# Patient Record
Sex: Female | Born: 1965 | Race: White | Hispanic: No | Marital: Married | Smoking: Current some day smoker
Health system: Southern US, Community
[De-identification: ages and names within clinical notes are randomized; demographics above are authoritative.]

## PROBLEM LIST (undated history)

## (undated) DIAGNOSIS — I10 Essential (primary) hypertension: Secondary | ICD-10-CM

---

## 1998-07-02 ENCOUNTER — Other Ambulatory Visit: Admission: RE | Admit: 1998-07-02 | Discharge: 1998-07-02 | Payer: Self-pay | Admitting: Obstetrics and Gynecology

## 1998-12-04 ENCOUNTER — Observation Stay (HOSPITAL_COMMUNITY): Admission: RE | Admit: 1998-12-04 | Discharge: 1998-12-05 | Payer: Self-pay | Admitting: Orthopedic Surgery

## 1999-07-26 ENCOUNTER — Other Ambulatory Visit: Admission: RE | Admit: 1999-07-26 | Discharge: 1999-07-26 | Payer: Self-pay | Admitting: Obstetrics and Gynecology

## 2000-02-12 ENCOUNTER — Ambulatory Visit (HOSPITAL_COMMUNITY): Admission: RE | Admit: 2000-02-12 | Discharge: 2000-02-12 | Payer: Self-pay | Admitting: *Deleted

## 2000-02-12 ENCOUNTER — Encounter: Payer: Self-pay | Admitting: *Deleted

## 2000-02-27 ENCOUNTER — Encounter: Payer: Self-pay | Admitting: *Deleted

## 2000-02-27 ENCOUNTER — Ambulatory Visit (HOSPITAL_COMMUNITY): Admission: RE | Admit: 2000-02-27 | Discharge: 2000-02-27 | Payer: Self-pay | Admitting: *Deleted

## 2000-08-10 ENCOUNTER — Other Ambulatory Visit: Admission: RE | Admit: 2000-08-10 | Discharge: 2000-08-10 | Payer: Self-pay | Admitting: Obstetrics and Gynecology

## 2001-08-30 ENCOUNTER — Other Ambulatory Visit: Admission: RE | Admit: 2001-08-30 | Discharge: 2001-08-30 | Payer: Self-pay | Admitting: Obstetrics and Gynecology

## 2002-02-24 ENCOUNTER — Other Ambulatory Visit: Admission: RE | Admit: 2002-02-24 | Discharge: 2002-02-24 | Payer: Self-pay | Admitting: Obstetrics and Gynecology

## 2002-10-20 ENCOUNTER — Other Ambulatory Visit: Admission: RE | Admit: 2002-10-20 | Discharge: 2002-10-20 | Payer: Self-pay | Admitting: Obstetrics and Gynecology

## 2003-11-10 ENCOUNTER — Other Ambulatory Visit: Admission: RE | Admit: 2003-11-10 | Discharge: 2003-11-10 | Payer: Self-pay | Admitting: Obstetrics and Gynecology

## 2004-12-18 ENCOUNTER — Other Ambulatory Visit: Admission: RE | Admit: 2004-12-18 | Discharge: 2004-12-18 | Payer: Self-pay | Admitting: Obstetrics and Gynecology

## 2016-09-10 ENCOUNTER — Emergency Department (HOSPITAL_COMMUNITY): Payer: 59

## 2016-09-10 ENCOUNTER — Encounter (HOSPITAL_COMMUNITY): Payer: Self-pay | Admitting: Neurology

## 2016-09-10 ENCOUNTER — Emergency Department (HOSPITAL_COMMUNITY)
Admission: EM | Admit: 2016-09-10 | Discharge: 2016-09-10 | Disposition: A | Discharge status: Home / Self Care | Payer: 59 | Attending: Emergency Medicine | Admitting: Emergency Medicine

## 2016-09-10 DIAGNOSIS — Z79899 Other long term (current) drug therapy: Secondary | ICD-10-CM | POA: Diagnosis not present

## 2016-09-10 DIAGNOSIS — F17200 Nicotine dependence, unspecified, uncomplicated: Secondary | ICD-10-CM | POA: Diagnosis not present

## 2016-09-10 DIAGNOSIS — K219 Gastro-esophageal reflux disease without esophagitis: Secondary | ICD-10-CM | POA: Diagnosis not present

## 2016-09-10 DIAGNOSIS — R0602 Shortness of breath: Secondary | ICD-10-CM | POA: Insufficient documentation

## 2016-09-10 DIAGNOSIS — R0789 Other chest pain: Secondary | ICD-10-CM

## 2016-09-10 DIAGNOSIS — I10 Essential (primary) hypertension: Secondary | ICD-10-CM | POA: Insufficient documentation

## 2016-09-10 HISTORY — DX: Essential (primary) hypertension: I10

## 2016-09-10 LAB — BASIC METABOLIC PANEL
Anion gap: 7 (ref 5–15)
BUN: 15 mg/dL (ref 6–20)
CALCIUM: 10.1 mg/dL (ref 8.9–10.3)
CO2: 30 mmol/L (ref 22–32)
CREATININE: 0.7 mg/dL (ref 0.44–1.00)
Chloride: 103 mmol/L (ref 101–111)
GFR calc non Af Amer: >60 mL/min (ref >60)
Glucose, Bld: 120 mg/dL — ABNORMAL HIGH (ref 65–99)
Potassium: 3.9 mmol/L (ref 3.5–5.1)
SODIUM: 140 mmol/L (ref 135–145)

## 2016-09-10 LAB — I-STAT TROPONIN, ED
TROPONIN I, POC: 0 ng/mL (ref 0.00–0.08)
Troponin i, poc: 0 ng/mL (ref 0.00–0.08)

## 2016-09-10 LAB — CBC
HCT: 40.3 % (ref 36.0–46.0)
Hemoglobin: 13.3 g/dL (ref 12.0–15.0)
MCH: 29 pg (ref 26.0–34.0)
MCHC: 33 g/dL (ref 30.0–36.0)
MCV: 88 fL (ref 78.0–100.0)
PLATELETS: 242 10*3/uL (ref 150–400)
RBC: 4.58 MIL/uL (ref 3.87–5.11)
RDW: 12 % (ref 11.5–15.5)
WBC: 7.6 10*3/uL (ref 4.0–10.5)

## 2016-09-10 MED ORDER — GI COCKTAIL ~~LOC~~
30.0000 mL | Freq: Once | ORAL | Status: AC
  Administered 2016-09-10: 30 mL via ORAL
  Filled 2016-09-10: qty 30

## 2016-09-10 MED ORDER — ACETAMINOPHEN 325 MG PO TABS
650.0000 mg | ORAL_TABLET | Freq: Once | ORAL | Status: AC
Start: 2016-09-10 — End: 2016-09-10
  Administered 2016-09-10: 650 mg via ORAL
  Filled 2016-09-10: qty 2

## 2016-09-10 NOTE — ED Provider Notes (Signed)
[facility]-EMERGENCY DEPT Provider Note   CSN: [id number] Arrival date & time: 09/10/16  0944     History   Chief Complaint Chief Complaint  Patient presents with  . Hypertension    HPI Katherine Boyer is a [age] y.o. female with a PMHx of HTN, who presents to the ED with multiple complaints. Her primary complaint is noticing that her blood pressure was going up and causing her to "not feel well". She states that last night she checked it and it was 169/101, this morning it was elevated again so she took her lisinopril 5 mg and after a little while it was 140/109, but because she had a mild headache just didn't feel well she came to the emergency room. Her blood pressure has continued to trend down since arrival. She reports that over the last 3 weeks she had a cough and some sinus drainage, reports that it had been clearing up, admits to taking Sudafed and NyQuil 2 weeks ago but none since then, but states that she continues to feel somewhat short of breath. Her PCP felt that this was likely indigestion she was complaining of some hoarseness in her voice, she was started on omeprazole and just recently started that. She still continues to have mild shortness of breath and chest congestion sensation in her chest. No other treatments tried, doesn't take anything else for her blood pressure, and no known aggravating factors for her symptoms.  She denies fevers, chills, vision changes, diaphoresis, lightheadedness, wheezing, leg swelling, recent travel/surgery/immobilization, estrogen use, personal or family history of DVT/PE, abd pain, N/V/D/C, hematuria, dysuria, myalgias, arthralgias, numbness, tingling, weakness, or any other complaints at this time. Nonsmoker. +FHx of CABG in her father. Of note, admits to being under a lot of stress recently, and thinks this is contributing to her current symptoms   The history is provided by the patient and medical records. No language interpreter was used.    Hypertension  This is a recurrent problem. The current episode started more than 1 week ago. The problem occurs constantly. The problem has been gradually improving. Associated symptoms include chest pain (congestion sensation), headaches and shortness of breath. Pertinent negatives include no abdominal pain. Nothing aggravates the symptoms. Relieved by: lisinopril 5mg . Treatments tried: lisinopril 5mg . The treatment provided moderate relief.    Past Medical History:  Diagnosis Date  . Hypertension     There are no active problems to display for this patient.   History reviewed. No pertinent surgical history.  OB History    No data available       Home Medications    Prior to Admission medications   Not on File    Family History No family history on file.  Social History Social History  Substance Use Topics  . Smoking status: Current Some Day Smoker  . Smokeless tobacco: Never Used  . Alcohol use Yes     Allergies   Patient has no known allergies.   Review of Systems Review of Systems  Constitutional: Negative for chills, diaphoresis and fever.  HENT: Positive for congestion and postnasal drip.   Eyes: Negative for visual disturbance.  Respiratory: Positive for cough and shortness of breath. Negative for wheezing.   Cardiovascular: Positive for chest pain (congestion sensation).  Gastrointestinal: Negative for abdominal pain, constipation, diarrhea, nausea and vomiting.  Genitourinary: Negative for dysuria and hematuria.  Musculoskeletal: Negative for arthralgias and myalgias.  Skin: Negative for color change.  Allergic/Immunologic: Negative for immunocompromised state.  Neurological: Positive for  headaches. Negative for weakness, light-headedness and numbness.  Psychiatric/Behavioral: Negative for confusion.   10 Systems reviewed and are negative for acute change except as noted in the HPI.   Physical Exam Updated Vital Signs BP 148/99 (BP Location:  Right Arm)   Pulse 80   Temp 97.8 F (36.6 C) (Oral)   Resp 12   Ht 5\' 3"  (1.6 m)   Wt 51.3 kg   SpO2 99%   BMI 20.02 kg/m    Physical Exam  Constitutional: She is oriented to person, place, and time. Vital signs are normal. She appears well-developed and well-nourished.  Non-toxic appearance. No distress.  Afebrile, nontoxic, NAD  HENT:  Head: Normocephalic and atraumatic.  Mouth/Throat: Oropharynx is clear and moist and mucous membranes are normal.  Eyes: Conjunctivae and EOM are normal. Pupils are equal, round, and reactive to light. Right eye exhibits no discharge. Left eye exhibits no discharge.  PERRL, EOMI, no nystagmus, no visual field deficits   Neck: Normal range of motion. Neck supple. No spinous process tenderness and no muscular tenderness present. No neck rigidity. Normal range of motion present.  FROM intact without spinous process TTP, no bony stepoffs or deformities, no paraspinous muscle TTP or muscle spasms. No rigidity or meningeal signs. No bruising or swelling.   Cardiovascular: Normal rate, regular rhythm, normal heart sounds and intact distal pulses.  Exam reveals no gallop and no friction rub.   No murmur heard. RRR, nl s1/s2, no m/r/g, distal pulses intact, no pedal edema   Pulmonary/Chest: Effort normal and breath sounds normal. No respiratory distress. She has no decreased breath sounds. She has no wheezes. She has no rhonchi. She has no rales. She exhibits no tenderness, no crepitus, no deformity and no retraction.  CTAB in all lung fields, no w/r/r, no hypoxia or increased WOB, speaking in full sentences, SpO2 99% on RA Chest wall nonTTP without crepitus, deformities, or retractions   Abdominal: Soft. Normal appearance and bowel sounds are normal. She exhibits no distension. There is no tenderness. There is no rigidity, no rebound, no guarding, no CVA tenderness, no tenderness at McBurney's point and negative Murphy's sign.  Musculoskeletal: Normal range of  motion.  MAE x4 Strength and sensation grossly intact in all extremities Distal pulses intact Gait steady  Neurological: She is alert and oriented to person, place, and time. She has normal strength. No cranial nerve deficit or sensory deficit. Coordination and gait normal. GCS eye subscore is 4. GCS verbal subscore is 5. GCS motor subscore is 6.  CN 2-12 grossly intact A&O x4 GCS 15 Sensation and strength intact Gait nonataxic Coordination with finger-to-nose WNL  Skin: Skin is warm, dry and intact. No rash noted.  Psychiatric: She has a normal mood and affect.  Nursing note and vitals reviewed.    ED Treatments / Results  Labs (all labs ordered are listed, but only abnormal results are displayed) Labs Reviewed  BASIC METABOLIC PANEL - Abnormal; Notable for the following:       Result Value   Glucose, Bld 120 (*)    All other components within normal limits  CBC  I-STAT TROPOININ, ED  I-STAT TROPOININ, ED    EKG  EKG Interpretation  Date/Time:  Wednesday September 10 2016 09:54:13 EST Ventricular Rate:  76 PR Interval:  132 QRS Duration: 82 QT Interval:  362 QTC Calculation: 407 R Axis:   71 Text Interpretation:  Normal sinus rhythm Right atrial enlargement Borderline ECG No old tracing to compare Confirmed by  [facility]ension Set[facility] Northwest HospitalCARDAMA MD, PEDRO [phone]) on 09/10/2016 1:37:34 PM       Radiology Dg Chest 2 View  Result Date: 09/10/2016 CLINICAL DATA:  Chest tightness.  Hypertension. EXAM: CHEST  2 VIEW COMPARISON:  None. FINDINGS: Lungs are clear. Heart size and pulmonary vascularity are normal. No adenopathy. No pneumothorax. No bone lesions. IMPRESSION: No edema or consolidation. Electronically Signed   By: Bretta BangWilliam  Woodruff III M.D.   On: 09/10/2016 10:19    Procedures Procedures (including critical care time)  Medications Ordered in ED Medications  gi cocktail (Maalox,Lidocaine,Donnatal) (30 mLs Oral Given 09/10/16 1248)  acetaminophen (TYLENOL) tablet 650 mg (650 mg Oral  Given 09/10/16 1248)     Initial Impression / Assessment and Plan / ED Course  I have reviewed the triage vital signs and the nursing notes.  Pertinent labs & imaging results that were available during my care of the patient were reviewed by me and considered in my medical decision making (see chart for details).  Clinical Course     50 y.o. female here with complaints of hypertension as well as "not feeling well" for the last several weeks. She noticed her blood pressure was 169/101 last night, took her lisinopril 5 mg this morning and that it was 140/109 and has trended down since then. She states that she had a mild headache just not feeling well which she attributed to the hypertension. Over the last 3 weeks she's had a cough and sinus drainage which has been clearing up but she admits to taking Sudafed and NyQuil which likely contributed to her blood pressure, states that even now she still feels like she has something in her chest that she needs to cough up and still feels slightly short of breath. Was started on omeprazole and told that the symptoms were likely related to indigestion, which I feel is likely. Overall, exam benign, no focal neuro deficits, no reproducible chest tenderness, lung exam clear, no tachycardia/hypoxia/LE swelling. Labs completely reassuring and EKG unremarkable. Doubt PE/dissection/ACS/other emergent cardiopulmonary conditions, doubt other emergent intracranial pathologies. Her symptoms are likely multifactorial, some of them coming from her hypertension, and some of them coming from her silent reflux. Given cardiac history in her family, will repeat troponin at 3 hour mark, will give GI cocktail and Tylenol. Will reassess shortly. Doubt need for HTN meds, BP 130s/90s during exam.   2:10 PM Repeat trop neg. Pt feeling better. BP remaining stable in the 140s/90-100s. We could increase her lisinopril to 10mg  daily to see if this helps with her BP, and have her f/up with  PCP. Continue omeprazole, could try additional zantac as needed for additional symptom control. GERD diet modifications discussed. F/up with PCP in 1wk. I explained the diagnosis and have given explicit precautions to return to the ER including for any other new or worsening symptoms. The patient understands and accepts the medical plan as it's been dictated and I have answered their questions. Discharge instructions concerning home care and prescriptions have been given. The patient is STABLE and is discharged to home in good condition.   Final Clinical Impressions(s) / ED Diagnoses   Final diagnoses:  Essential hypertension  Atypical chest pain  SOB (shortness of breath)  Gastroesophageal reflux disease, esophagitis presence not specified    New Prescriptions New Prescriptions   No medications on file     Allen DerryMercedes Camprubi-Soms, PA-C 09/10/16 1410    Nira ConnPedro Eduardo Cardama, MD 09/10/16 [phone]

## 2016-09-10 NOTE — ED Triage Notes (Signed)
Pt reports HTN, h/a. For last few weeks has had cough, sinus drainage. Has been taking lisinopril. Reports CP and SOB, has been intermittent for last few weeks.

## 2016-09-10 NOTE — Discharge Instructions (Signed)
Your chest pain is likely from silent reflux. You will need to take your omeprazole as directed, may consider using additional zantac as needed for additional relief, and avoid spicy/fatty/acidic foods, avoid soda/coffee/tea/alcohol. Avoid laying down flat within 30 minutes of eating. Avoid NSAIDs like ibuprofen/aleve/motrin/etc on an empty stomach. May consider using over the counter tums/maalox as needed for additional relief. Use tylenol as needed for pain.   For your blood pressure, increase your lisinopril to 10mg  daily. Keep a record of your blood pressure readings to take to your doctor at your next visit. Avoid salty foods. Follow up with your regular doctor in one week for ongoing evaluation of your symptoms. Return to the ER for changes or worsening symptoms.

## 2016-10-17 DIAGNOSIS — Z01419 Encounter for gynecological examination (general) (routine) without abnormal findings: Secondary | ICD-10-CM | POA: Diagnosis not present

## 2016-10-29 DIAGNOSIS — K219 Gastro-esophageal reflux disease without esophagitis: Secondary | ICD-10-CM | POA: Diagnosis not present

## 2016-11-21 DIAGNOSIS — Z1211 Encounter for screening for malignant neoplasm of colon: Secondary | ICD-10-CM | POA: Diagnosis not present

## 2016-11-21 DIAGNOSIS — R1310 Dysphagia, unspecified: Secondary | ICD-10-CM | POA: Diagnosis not present

## 2016-11-21 DIAGNOSIS — K219 Gastro-esophageal reflux disease without esophagitis: Secondary | ICD-10-CM | POA: Diagnosis not present

## 2016-11-21 DIAGNOSIS — K621 Rectal polyp: Secondary | ICD-10-CM | POA: Diagnosis not present

## 2016-11-21 DIAGNOSIS — K222 Esophageal obstruction: Secondary | ICD-10-CM | POA: Diagnosis not present

## 2016-11-21 DIAGNOSIS — R490 Dysphonia: Secondary | ICD-10-CM | POA: Diagnosis not present

## 2016-11-28 DIAGNOSIS — H40003 Preglaucoma, unspecified, bilateral: Secondary | ICD-10-CM | POA: Diagnosis not present

## 2017-01-12 DIAGNOSIS — R1310 Dysphagia, unspecified: Secondary | ICD-10-CM | POA: Diagnosis not present

## 2017-03-18 DIAGNOSIS — J039 Acute tonsillitis, unspecified: Secondary | ICD-10-CM | POA: Diagnosis not present

## 2017-03-18 DIAGNOSIS — J0100 Acute maxillary sinusitis, unspecified: Secondary | ICD-10-CM | POA: Diagnosis not present

## 2017-05-28 DIAGNOSIS — R21 Rash and other nonspecific skin eruption: Secondary | ICD-10-CM | POA: Diagnosis not present

## 2017-08-28 DIAGNOSIS — J329 Chronic sinusitis, unspecified: Secondary | ICD-10-CM | POA: Diagnosis not present

## 2017-08-28 DIAGNOSIS — J40 Bronchitis, not specified as acute or chronic: Secondary | ICD-10-CM | POA: Diagnosis not present

## 2017-09-08 DIAGNOSIS — J189 Pneumonia, unspecified organism: Secondary | ICD-10-CM | POA: Diagnosis not present

## 2017-10-27 DIAGNOSIS — Z01419 Encounter for gynecological examination (general) (routine) without abnormal findings: Secondary | ICD-10-CM | POA: Diagnosis not present

## 2017-11-06 DIAGNOSIS — H1013 Acute atopic conjunctivitis, bilateral: Secondary | ICD-10-CM | POA: Diagnosis not present

## 2017-11-12 DIAGNOSIS — M8580 Other specified disorders of bone density and structure, unspecified site: Secondary | ICD-10-CM | POA: Diagnosis not present

## 2017-12-02 DIAGNOSIS — R531 Weakness: Secondary | ICD-10-CM | POA: Diagnosis not present

## 2018-03-07 ENCOUNTER — Emergency Department (HOSPITAL_COMMUNITY)
Admission: EM | Admit: 2018-03-07 | Discharge: 2018-03-07 | Disposition: A | Discharge status: Home / Self Care | Payer: 59 | Attending: Emergency Medicine | Admitting: Emergency Medicine

## 2018-03-07 ENCOUNTER — Encounter (HOSPITAL_COMMUNITY): Payer: Self-pay | Admitting: Emergency Medicine

## 2018-03-07 ENCOUNTER — Other Ambulatory Visit: Payer: Self-pay

## 2018-03-07 ENCOUNTER — Emergency Department (HOSPITAL_COMMUNITY): Payer: 59

## 2018-03-07 DIAGNOSIS — R638 Other symptoms and signs concerning food and fluid intake: Secondary | ICD-10-CM | POA: Diagnosis not present

## 2018-03-07 DIAGNOSIS — F17200 Nicotine dependence, unspecified, uncomplicated: Secondary | ICD-10-CM | POA: Diagnosis not present

## 2018-03-07 DIAGNOSIS — R0789 Other chest pain: Secondary | ICD-10-CM | POA: Insufficient documentation

## 2018-03-07 DIAGNOSIS — K219 Gastro-esophageal reflux disease without esophagitis: Secondary | ICD-10-CM

## 2018-03-07 DIAGNOSIS — I10 Essential (primary) hypertension: Secondary | ICD-10-CM | POA: Insufficient documentation

## 2018-03-07 DIAGNOSIS — R079 Chest pain, unspecified: Secondary | ICD-10-CM

## 2018-03-07 LAB — BASIC METABOLIC PANEL
Anion gap: 11 (ref 5–15)
BUN: 11 mg/dL (ref 6–20)
CHLORIDE: 104 mmol/L (ref 101–111)
CO2: 22 mmol/L (ref 22–32)
CREATININE: 0.7 mg/dL (ref 0.44–1.00)
Calcium: 9.5 mg/dL (ref 8.9–10.3)
GFR calc Af Amer: >60 mL/min (ref >60)
GFR calc non Af Amer: >60 mL/min (ref >60)
Glucose, Bld: 101 mg/dL — ABNORMAL HIGH (ref 65–99)
Potassium: 3.6 mmol/L (ref 3.5–5.1)
SODIUM: 137 mmol/L (ref 135–145)

## 2018-03-07 LAB — CBC
HEMATOCRIT: 40.8 % (ref 36.0–46.0)
Hemoglobin: 13.6 g/dL (ref 12.0–15.0)
MCH: 28.6 pg (ref 26.0–34.0)
MCHC: 33.3 g/dL (ref 30.0–36.0)
MCV: 85.9 fL (ref 78.0–100.0)
PLATELETS: 210 10*3/uL (ref 150–400)
RBC: 4.75 MIL/uL (ref 3.87–5.11)
RDW: 11.3 % — AB (ref 11.5–15.5)
WBC: 6.4 10*3/uL (ref 4.0–10.5)

## 2018-03-07 LAB — I-STAT TROPONIN, ED: TROPONIN I, POC: 0 ng/mL (ref 0.00–0.08)

## 2018-03-07 MED ORDER — GI COCKTAIL ~~LOC~~
30.0000 mL | Freq: Once | ORAL | Status: AC
  Administered 2018-03-07: 30 mL via ORAL
  Filled 2018-03-07: qty 30

## 2018-03-07 MED ORDER — OMEPRAZOLE 40 MG PO CPDR: 40.0000 mg | DELAYED_RELEASE_CAPSULE | Freq: Every day | ORAL | 1 refills | Status: AC

## 2018-03-07 MED ORDER — ONDANSETRON HCL 4 MG/2ML IJ SOLN
4.0000 mg | Freq: Once | INTRAMUSCULAR | Status: AC
  Administered 2018-03-07: 4 mg via INTRAVENOUS
  Filled 2018-03-07: qty 2

## 2018-03-07 MED ORDER — FAMOTIDINE IN NACL 20-0.9 MG/50ML-% IV SOLN
20.0000 mg | Freq: Once | INTRAVENOUS | Status: AC
  Administered 2018-03-07: 20 mg via INTRAVENOUS
  Filled 2018-03-07: qty 50

## 2018-03-07 MED ORDER — SODIUM CHLORIDE 0.9 % IV BOLUS
1000.0000 mL | Freq: Once | INTRAVENOUS | Status: AC
  Administered 2018-03-07: 1000 mL via INTRAVENOUS

## 2018-03-07 NOTE — Discharge Instructions (Addendum)
I have written you a prescription for Omeprazole. You may continue taking this daily for symptom improvement. You may follow-up with your PCP to discuss the need for additional medication to control symptoms.

## 2018-03-07 NOTE — ED Notes (Signed)
Pt. Transported to xray 

## 2018-03-07 NOTE — ED Provider Notes (Signed)
[facility] EMERGENCY DEPARTMENT Provider Note  CSN: [id number] Arrival date & time: 03/07/18  1844  History   Chief Complaint Chief Complaint  Patient presents with  . Chest Pain   HPI Katherine Boyer is a 52 y.o. female with a medical history of HTN and GERD who presented to the ED for midsternal chest pain x2 weeks. She describes the pain as tightness and heaviness. She states it has awaken her from her sleep. Patient states it feels different from heartburn pain she has had in the past. She cannot identify any changes with positions or with eating. Associated symptoms: decreased appetite and nausea. Patient has tried omeprazole and Tums prior to coming to the ED, but has not taken any in over a week because she ran out.  Denies SOB, palpitations, diaphoresis, leg swelling, lightheadedness, dizziness or abdominal pain.   Past Medical History:  Diagnosis Date  . Hypertension     There are no active problems to display for this patient.   History reviewed. No pertinent surgical history.   OB History   None      Home Medications    Prior to Admission medications   Medication Sig Start Date End Date Taking? Authorizing Provider  solifenacin (VESICARE) 5 MG tablet Take 5 mg by mouth daily. 02/27/18  Yes [provider]  omeprazole (PRILOSEC) 40 MG capsule Take 1 capsule (40 mg total) by mouth daily. 03/07/18 04/06/18  Mortis, Sharyon Medicus, PA-C    Family History No family history on file.  Social History Social History   Tobacco Use  . Smoking status: Current Some Day Smoker  . Smokeless tobacco: Never Used  Substance Use Topics  . Alcohol use: Yes  . Drug use: Not on file     Allergies   Patient has no known allergies.   Review of Systems Review of Systems  Constitutional: Positive for appetite change. Negative for diaphoresis and fever.  Respiratory: Positive for chest tightness. Negative for shortness of breath.   Cardiovascular:  Positive for chest pain. Negative for palpitations and leg swelling.  Gastrointestinal: Positive for nausea and vomiting. Negative for abdominal pain.  Endocrine: Negative.   Neurological: Negative.      Physical Exam Updated Vital Signs BP (!) 139/91   Pulse 83   Temp 99.6 F (37.6 C) (Oral)   Resp 20   Ht 5\' 4"  (1.626 m)   Wt 51.3 kg (113 lb)   SpO2 96%   BMI 19.40 kg/m   Physical Exam  Constitutional: She appears well-developed and well-nourished. She does not appear ill.  Cardiovascular: Normal rate, regular rhythm, intact distal pulses and normal pulses.  Pulmonary/Chest: Effort normal and breath sounds normal.  Abdominal: Soft. Bowel sounds are normal. There is no tenderness.  Musculoskeletal:       Right lower leg: She exhibits no edema.       Left lower leg: She exhibits no edema.  Skin: Skin is warm and dry. Capillary refill takes less than 2 seconds. She is not diaphoretic. No cyanosis. Nails show no clubbing.  Nursing note and vitals reviewed.    ED Treatments / Results  Labs (all labs ordered are listed, but only abnormal results are displayed) Labs Reviewed  BASIC METABOLIC PANEL - Abnormal; Notable for the following components:      Result Value   Glucose, Bld 101 (*)    All other components within normal limits  CBC - Abnormal; Notable for the following components:   RDW  11.3 (*)    All other components within normal limits  I-STAT TROPONIN, ED  I-STAT BETA HCG BLOOD, ED ([facility], [facility], [facility] ONLY)    EKG None  Radiology Dg Chest 2 View  Result Date: 03/07/2018 CLINICAL DATA:  Pt c/o CP for longer than 1 week. Pt started to notice the pain 7 days ago when she ran out of Heartburn meds. Pt has been taking OTC meds for heartburn. Pt is central chest, feels like pressure. Pt c/o N/V starting today. Pain radiates to L arm. EXAM: CHEST - 2 VIEW COMPARISON:  None. FINDINGS: Normal mediastinum and cardiac silhouette. Normal pulmonary vasculature. No evidence of  effusion, infiltrate, or pneumothorax. No acute bony abnormality. IMPRESSION: Normal chest radiograph. Electronically Signed   By: Genevive Bi M.D.   On: 03/07/2018 20:12    Procedures Procedures (including critical care time)  Medications Ordered in ED Medications  gi cocktail (Maalox,Lidocaine,Donnatal) (has no administration in time range)  sodium chloride 0.9 % bolus 1,000 mL (1,000 mLs Intravenous New Bag/Given 03/07/18 2100)  famotidine (PEPCID) IVPB 20 mg premix (20 mg Intravenous New Bag/Given 03/07/18 2101)  gi cocktail (Maalox,Lidocaine,Donnatal) (30 mLs Oral Given 03/07/18 2100)  ondansetron (ZOFRAN) injection 4 mg (4 mg Intravenous Given 03/07/18 2100)     Initial Impression / Assessment and Plan / ED Course  Triage vital signs and the nursing notes have been reviewed.  Pertinent labs & imaging results that were available during care of the patient were reviewed and considered in medical decision making (see chart for details).  Patient presents in no acute distress and is well appearing. HEART score was 2 and is at low risk of an acute cardiac event today. Physical exam, initial EKG, CXR and troponin are reassuring that patient is not experiencing an acute cardiac or pulmonary event. Denies systemic s/s that suggest an infectious etiology. History is more consistent with GI etiology for midsternal/epigastric pain. Likely GERD as there are no associations with eating and patient does not have these symptoms when she is on her PPI. No signs that she has an acute GI blood loss that requires additional evaluation.  Clinical Course as of Mar 07 2140  Sun Mar 07, 2018  2015 EKG showed NSR. No ST elevations/depressions or signs of acute ischemia or infarct. This is reassuring in combination with negative troponin which assists in evaluating and ruling out an acute cardiac process. Labs and CXR are also reassuring.    [GM]  2106 GI cocktail, Pepcid and Zofran administered for acute  relief. NS bolus x1 given as well. Will reassess pt after completion.   [GM]  2137 Pt reports pain improvement after IV fluids, Pepcid, Zofran and GI cocktail.   [GM]    Clinical Course User Index [GM] Mortis, Sharyon Medicus, PA-C   Final Clinical Impressions(s) / ED Diagnoses  1. Chest Pain. 2/2 GERD or PUD. Omeprazole 40mg  QD prescribed. Relief achieved in ED with GI cocktail and PPI. Education provided on OTC and supportive treatment.  Dispo: Home. After thorough clinical evaluation, this patient is determined to be medically stable and can be safely discharged with the previously mentioned treatment and/or outpatient follow-up/referral(s). At this time, there are no other apparent medical conditions that require further screening, evaluation or treatment.   Final diagnoses:  Chest pain due to GERD    ED Discharge Orders        Ordered    omeprazole (PRILOSEC) 40 MG capsule  Daily     03/07/18 2127  Reva BoresMortis, Gabrielle I, PA-C 03/07/18 2142    Loren RacerYelverton, David, MD 03/16/18 [phone]

## 2018-03-07 NOTE — ED Triage Notes (Signed)
Pt c/o CP for longer than 1 week.  Pt started to notice the pain 7 days ago when she ran out of Heartburn meds. Pt has been taking OTC meds for heartburn.  Pt is central chest, feels like pressure. Pt c/o N/V starting today. Pain radiates to L arm.

## 2018-03-29 DIAGNOSIS — R1310 Dysphagia, unspecified: Secondary | ICD-10-CM | POA: Diagnosis not present

## 2018-05-18 DIAGNOSIS — H47393 Other disorders of optic disc, bilateral: Secondary | ICD-10-CM | POA: Diagnosis not present

## 2019-11-01 DIAGNOSIS — Z13820 Encounter for screening for osteoporosis: Secondary | ICD-10-CM | POA: Diagnosis not present

## 2019-11-01 DIAGNOSIS — Z1231 Encounter for screening mammogram for malignant neoplasm of breast: Secondary | ICD-10-CM | POA: Diagnosis not present

## 2019-11-14 DIAGNOSIS — M810 Age-related osteoporosis without current pathological fracture: Secondary | ICD-10-CM | POA: Diagnosis not present

## 2020-03-07 DIAGNOSIS — Z87448 Personal history of other diseases of urinary system: Secondary | ICD-10-CM | POA: Diagnosis not present

## 2020-03-07 DIAGNOSIS — Z79899 Other long term (current) drug therapy: Secondary | ICD-10-CM | POA: Diagnosis not present

## 2020-03-07 DIAGNOSIS — I10 Essential (primary) hypertension: Secondary | ICD-10-CM | POA: Diagnosis not present

## 2020-03-07 DIAGNOSIS — Z6821 Body mass index (BMI) 21.0-21.9, adult: Secondary | ICD-10-CM | POA: Diagnosis not present

## 2020-03-14 DIAGNOSIS — S2231XA Fracture of one rib, right side, initial encounter for closed fracture: Secondary | ICD-10-CM | POA: Diagnosis not present

## 2020-03-14 DIAGNOSIS — S2241XA Multiple fractures of ribs, right side, initial encounter for closed fracture: Secondary | ICD-10-CM | POA: Diagnosis not present

## 2020-03-14 DIAGNOSIS — R0789 Other chest pain: Secondary | ICD-10-CM | POA: Diagnosis not present

## 2020-03-14 DIAGNOSIS — X58XXXA Exposure to other specified factors, initial encounter: Secondary | ICD-10-CM | POA: Diagnosis not present

## 2020-03-27 DIAGNOSIS — M810 Age-related osteoporosis without current pathological fracture: Secondary | ICD-10-CM | POA: Diagnosis not present

## 2020-03-27 DIAGNOSIS — Z1331 Encounter for screening for depression: Secondary | ICD-10-CM | POA: Diagnosis not present

## 2020-03-27 DIAGNOSIS — Z6821 Body mass index (BMI) 21.0-21.9, adult: Secondary | ICD-10-CM | POA: Diagnosis not present

## 2020-03-27 DIAGNOSIS — S2231XS Fracture of one rib, right side, sequela: Secondary | ICD-10-CM | POA: Diagnosis not present

## 2020-03-30 DIAGNOSIS — M8448XA Pathological fracture, other site, initial encounter for fracture: Secondary | ICD-10-CM | POA: Diagnosis not present

## 2020-03-30 DIAGNOSIS — S2231XD Fracture of one rib, right side, subsequent encounter for fracture with routine healing: Secondary | ICD-10-CM | POA: Diagnosis not present

## 2020-03-30 DIAGNOSIS — N200 Calculus of kidney: Secondary | ICD-10-CM | POA: Diagnosis not present

## 2020-06-13 DIAGNOSIS — J019 Acute sinusitis, unspecified: Secondary | ICD-10-CM | POA: Diagnosis not present

## 2020-06-13 DIAGNOSIS — Z20828 Contact with and (suspected) exposure to other viral communicable diseases: Secondary | ICD-10-CM | POA: Diagnosis not present

## 2020-07-03 DIAGNOSIS — Z6821 Body mass index (BMI) 21.0-21.9, adult: Secondary | ICD-10-CM | POA: Diagnosis not present

## 2020-07-03 DIAGNOSIS — M810 Age-related osteoporosis without current pathological fracture: Secondary | ICD-10-CM | POA: Diagnosis not present

## 2020-07-03 DIAGNOSIS — I10 Essential (primary) hypertension: Secondary | ICD-10-CM | POA: Diagnosis not present

## 2020-07-06 DIAGNOSIS — M810 Age-related osteoporosis without current pathological fracture: Secondary | ICD-10-CM | POA: Diagnosis not present

## 2020-07-31 DIAGNOSIS — M810 Age-related osteoporosis without current pathological fracture: Secondary | ICD-10-CM | POA: Diagnosis not present

## 2020-08-08 DIAGNOSIS — Z6821 Body mass index (BMI) 21.0-21.9, adult: Secondary | ICD-10-CM | POA: Diagnosis not present

## 2020-08-08 DIAGNOSIS — I10 Essential (primary) hypertension: Secondary | ICD-10-CM | POA: Diagnosis not present

## 2020-09-24 DIAGNOSIS — K222 Esophageal obstruction: Secondary | ICD-10-CM | POA: Diagnosis not present

## 2020-10-29 DIAGNOSIS — H02831 Dermatochalasis of right upper eyelid: Secondary | ICD-10-CM | POA: Diagnosis not present

## 2020-10-29 DIAGNOSIS — H02834 Dermatochalasis of left upper eyelid: Secondary | ICD-10-CM | POA: Diagnosis not present

## 2020-10-29 DIAGNOSIS — H40013 Open angle with borderline findings, low risk, bilateral: Secondary | ICD-10-CM | POA: Diagnosis not present

## 2020-10-29 DIAGNOSIS — H25813 Combined forms of age-related cataract, bilateral: Secondary | ICD-10-CM | POA: Diagnosis not present

## 2021-01-14 DIAGNOSIS — Z6822 Body mass index (BMI) 22.0-22.9, adult: Secondary | ICD-10-CM | POA: Diagnosis not present

## 2021-01-14 DIAGNOSIS — Z01419 Encounter for gynecological examination (general) (routine) without abnormal findings: Secondary | ICD-10-CM | POA: Diagnosis not present

## 2021-01-14 DIAGNOSIS — Z1231 Encounter for screening mammogram for malignant neoplasm of breast: Secondary | ICD-10-CM | POA: Diagnosis not present

## 2021-01-30 DIAGNOSIS — M810 Age-related osteoporosis without current pathological fracture: Secondary | ICD-10-CM | POA: Diagnosis not present

## 2021-06-27 DIAGNOSIS — H02831 Dermatochalasis of right upper eyelid: Secondary | ICD-10-CM | POA: Diagnosis not present

## 2021-06-27 DIAGNOSIS — H02834 Dermatochalasis of left upper eyelid: Secondary | ICD-10-CM | POA: Diagnosis not present

## 2021-08-02 DIAGNOSIS — M810 Age-related osteoporosis without current pathological fracture: Secondary | ICD-10-CM | POA: Diagnosis not present

## 2021-08-17 DIAGNOSIS — J111 Influenza due to unidentified influenza virus with other respiratory manifestations: Secondary | ICD-10-CM | POA: Diagnosis not present

## 2021-09-11 DIAGNOSIS — J029 Acute pharyngitis, unspecified: Secondary | ICD-10-CM | POA: Diagnosis not present

## 2021-10-18 DIAGNOSIS — M810 Age-related osteoporosis without current pathological fracture: Secondary | ICD-10-CM | POA: Diagnosis not present

## 2021-10-18 DIAGNOSIS — Z13220 Encounter for screening for lipoid disorders: Secondary | ICD-10-CM | POA: Diagnosis not present

## 2021-10-18 DIAGNOSIS — Z79899 Other long term (current) drug therapy: Secondary | ICD-10-CM | POA: Diagnosis not present

## 2021-10-18 DIAGNOSIS — K219 Gastro-esophageal reflux disease without esophagitis: Secondary | ICD-10-CM | POA: Diagnosis not present

## 2021-10-18 DIAGNOSIS — I10 Essential (primary) hypertension: Secondary | ICD-10-CM | POA: Diagnosis not present

## 2021-10-18 DIAGNOSIS — Z23 Encounter for immunization: Secondary | ICD-10-CM | POA: Diagnosis not present

## 2021-10-31 DIAGNOSIS — H40013 Open angle with borderline findings, low risk, bilateral: Secondary | ICD-10-CM | POA: Diagnosis not present

## 2021-10-31 DIAGNOSIS — H25813 Combined forms of age-related cataract, bilateral: Secondary | ICD-10-CM | POA: Diagnosis not present

## 2021-10-31 DIAGNOSIS — H02834 Dermatochalasis of left upper eyelid: Secondary | ICD-10-CM | POA: Diagnosis not present

## 2021-10-31 DIAGNOSIS — H02831 Dermatochalasis of right upper eyelid: Secondary | ICD-10-CM | POA: Diagnosis not present

## 2021-11-15 DIAGNOSIS — M25511 Pain in right shoulder: Secondary | ICD-10-CM | POA: Diagnosis not present

## 2021-11-16 ENCOUNTER — Other Ambulatory Visit: Payer: Self-pay | Admitting: Orthopedic Surgery

## 2021-11-16 DIAGNOSIS — M25511 Pain in right shoulder: Secondary | ICD-10-CM

## 2021-12-06 ENCOUNTER — Ambulatory Visit
Admission: RE | Admit: 2021-12-06 | Discharge: 2021-12-06 | Disposition: A | Discharge status: Home / Self Care | Payer: BC Managed Care – PPO | Source: Ambulatory Visit | Attending: Orthopedic Surgery | Admitting: Orthopedic Surgery

## 2021-12-06 ENCOUNTER — Other Ambulatory Visit: Payer: Self-pay

## 2021-12-06 DIAGNOSIS — M25511 Pain in right shoulder: Secondary | ICD-10-CM

## 2021-12-06 DIAGNOSIS — M25411 Effusion, right shoulder: Secondary | ICD-10-CM | POA: Diagnosis not present

## 2021-12-06 DIAGNOSIS — M19011 Primary osteoarthritis, right shoulder: Secondary | ICD-10-CM | POA: Diagnosis not present

## 2021-12-06 DIAGNOSIS — S46011A Strain of muscle(s) and tendon(s) of the rotator cuff of right shoulder, initial encounter: Secondary | ICD-10-CM | POA: Diagnosis not present

## 2021-12-13 DIAGNOSIS — M75111 Incomplete rotator cuff tear or rupture of right shoulder, not specified as traumatic: Secondary | ICD-10-CM | POA: Diagnosis not present

## 2022-01-15 DIAGNOSIS — Z1151 Encounter for screening for human papillomavirus (HPV): Secondary | ICD-10-CM | POA: Diagnosis not present

## 2022-01-15 DIAGNOSIS — Z1231 Encounter for screening mammogram for malignant neoplasm of breast: Secondary | ICD-10-CM | POA: Diagnosis not present

## 2022-01-15 DIAGNOSIS — M810 Age-related osteoporosis without current pathological fracture: Secondary | ICD-10-CM | POA: Diagnosis not present

## 2022-01-15 DIAGNOSIS — Z124 Encounter for screening for malignant neoplasm of cervix: Secondary | ICD-10-CM | POA: Diagnosis not present

## 2022-01-15 DIAGNOSIS — Z6822 Body mass index (BMI) 22.0-22.9, adult: Secondary | ICD-10-CM | POA: Diagnosis not present

## 2022-01-15 DIAGNOSIS — Z01419 Encounter for gynecological examination (general) (routine) without abnormal findings: Secondary | ICD-10-CM | POA: Diagnosis not present

## 2022-01-15 DIAGNOSIS — Z13820 Encounter for screening for osteoporosis: Secondary | ICD-10-CM | POA: Diagnosis not present

## 2022-01-17 DIAGNOSIS — H02834 Dermatochalasis of left upper eyelid: Secondary | ICD-10-CM | POA: Diagnosis not present

## 2022-01-17 DIAGNOSIS — H02831 Dermatochalasis of right upper eyelid: Secondary | ICD-10-CM | POA: Diagnosis not present

## 2022-01-28 DIAGNOSIS — G8918 Other acute postprocedural pain: Secondary | ICD-10-CM | POA: Diagnosis not present

## 2022-01-28 DIAGNOSIS — Y999 Unspecified external cause status: Secondary | ICD-10-CM | POA: Diagnosis not present

## 2022-01-28 DIAGNOSIS — M7551 Bursitis of right shoulder: Secondary | ICD-10-CM | POA: Diagnosis not present

## 2022-01-28 DIAGNOSIS — M659 Synovitis and tenosynovitis, unspecified: Secondary | ICD-10-CM | POA: Diagnosis not present

## 2022-01-28 DIAGNOSIS — S46011A Strain of muscle(s) and tendon(s) of the rotator cuff of right shoulder, initial encounter: Secondary | ICD-10-CM | POA: Diagnosis not present

## 2022-01-28 DIAGNOSIS — M7541 Impingement syndrome of right shoulder: Secondary | ICD-10-CM | POA: Diagnosis not present

## 2022-01-28 DIAGNOSIS — M7501 Adhesive capsulitis of right shoulder: Secondary | ICD-10-CM | POA: Diagnosis not present

## 2022-01-28 DIAGNOSIS — X58XXXA Exposure to other specified factors, initial encounter: Secondary | ICD-10-CM | POA: Diagnosis not present

## 2022-01-30 DIAGNOSIS — M25511 Pain in right shoulder: Secondary | ICD-10-CM | POA: Diagnosis not present

## 2022-01-30 DIAGNOSIS — M25611 Stiffness of right shoulder, not elsewhere classified: Secondary | ICD-10-CM | POA: Diagnosis not present

## 2022-02-05 DIAGNOSIS — M25511 Pain in right shoulder: Secondary | ICD-10-CM | POA: Diagnosis not present

## 2022-02-05 DIAGNOSIS — M25611 Stiffness of right shoulder, not elsewhere classified: Secondary | ICD-10-CM | POA: Diagnosis not present

## 2022-02-07 DIAGNOSIS — M25611 Stiffness of right shoulder, not elsewhere classified: Secondary | ICD-10-CM | POA: Diagnosis not present

## 2022-02-07 DIAGNOSIS — M25511 Pain in right shoulder: Secondary | ICD-10-CM | POA: Diagnosis not present

## 2022-02-10 DIAGNOSIS — M25511 Pain in right shoulder: Secondary | ICD-10-CM | POA: Diagnosis not present

## 2022-02-10 DIAGNOSIS — M25611 Stiffness of right shoulder, not elsewhere classified: Secondary | ICD-10-CM | POA: Diagnosis not present

## 2022-02-12 DIAGNOSIS — M25611 Stiffness of right shoulder, not elsewhere classified: Secondary | ICD-10-CM | POA: Diagnosis not present

## 2022-02-12 DIAGNOSIS — M25511 Pain in right shoulder: Secondary | ICD-10-CM | POA: Diagnosis not present

## 2022-02-18 DIAGNOSIS — M25511 Pain in right shoulder: Secondary | ICD-10-CM | POA: Diagnosis not present

## 2022-02-18 DIAGNOSIS — M810 Age-related osteoporosis without current pathological fracture: Secondary | ICD-10-CM | POA: Diagnosis not present

## 2022-02-18 DIAGNOSIS — M25611 Stiffness of right shoulder, not elsewhere classified: Secondary | ICD-10-CM | POA: Diagnosis not present

## 2022-02-21 DIAGNOSIS — M25511 Pain in right shoulder: Secondary | ICD-10-CM | POA: Diagnosis not present

## 2022-02-21 DIAGNOSIS — M25611 Stiffness of right shoulder, not elsewhere classified: Secondary | ICD-10-CM | POA: Diagnosis not present

## 2022-02-26 DIAGNOSIS — M25611 Stiffness of right shoulder, not elsewhere classified: Secondary | ICD-10-CM | POA: Diagnosis not present

## 2022-02-26 DIAGNOSIS — M25511 Pain in right shoulder: Secondary | ICD-10-CM | POA: Diagnosis not present

## 2022-03-05 DIAGNOSIS — M25511 Pain in right shoulder: Secondary | ICD-10-CM | POA: Diagnosis not present

## 2022-03-05 DIAGNOSIS — M25611 Stiffness of right shoulder, not elsewhere classified: Secondary | ICD-10-CM | POA: Diagnosis not present

## 2022-03-06 DIAGNOSIS — J069 Acute upper respiratory infection, unspecified: Secondary | ICD-10-CM | POA: Diagnosis not present

## 2022-03-12 DIAGNOSIS — M25611 Stiffness of right shoulder, not elsewhere classified: Secondary | ICD-10-CM | POA: Diagnosis not present

## 2022-03-12 DIAGNOSIS — M25511 Pain in right shoulder: Secondary | ICD-10-CM | POA: Diagnosis not present

## 2022-03-19 DIAGNOSIS — M25611 Stiffness of right shoulder, not elsewhere classified: Secondary | ICD-10-CM | POA: Diagnosis not present

## 2022-03-19 DIAGNOSIS — M25511 Pain in right shoulder: Secondary | ICD-10-CM | POA: Diagnosis not present

## 2022-03-21 DIAGNOSIS — M25611 Stiffness of right shoulder, not elsewhere classified: Secondary | ICD-10-CM | POA: Diagnosis not present

## 2022-03-21 DIAGNOSIS — M25511 Pain in right shoulder: Secondary | ICD-10-CM | POA: Diagnosis not present

## 2022-03-26 DIAGNOSIS — M25511 Pain in right shoulder: Secondary | ICD-10-CM | POA: Diagnosis not present

## 2022-03-26 DIAGNOSIS — M25611 Stiffness of right shoulder, not elsewhere classified: Secondary | ICD-10-CM | POA: Diagnosis not present

## 2022-04-02 DIAGNOSIS — M25511 Pain in right shoulder: Secondary | ICD-10-CM | POA: Diagnosis not present

## 2022-04-02 DIAGNOSIS — M25611 Stiffness of right shoulder, not elsewhere classified: Secondary | ICD-10-CM | POA: Diagnosis not present

## 2022-04-09 DIAGNOSIS — H02831 Dermatochalasis of right upper eyelid: Secondary | ICD-10-CM | POA: Diagnosis not present

## 2022-04-09 DIAGNOSIS — H02834 Dermatochalasis of left upper eyelid: Secondary | ICD-10-CM | POA: Diagnosis not present

## 2022-08-29 DIAGNOSIS — M8580 Other specified disorders of bone density and structure, unspecified site: Secondary | ICD-10-CM | POA: Diagnosis not present

## 2022-08-31 ENCOUNTER — Emergency Department (HOSPITAL_COMMUNITY): Payer: BC Managed Care – PPO

## 2022-08-31 ENCOUNTER — Encounter (HOSPITAL_COMMUNITY): Payer: Self-pay

## 2022-08-31 ENCOUNTER — Emergency Department (HOSPITAL_COMMUNITY)
Admission: EM | Admit: 2022-08-31 | Discharge: 2022-08-31 | Disposition: A | Discharge status: Home / Self Care | Payer: BC Managed Care – PPO | Attending: Emergency Medicine | Admitting: Emergency Medicine

## 2022-08-31 ENCOUNTER — Other Ambulatory Visit: Payer: Self-pay

## 2022-08-31 DIAGNOSIS — R1030 Lower abdominal pain, unspecified: Secondary | ICD-10-CM | POA: Diagnosis not present

## 2022-08-31 DIAGNOSIS — K5792 Diverticulitis of intestine, part unspecified, without perforation or abscess without bleeding: Secondary | ICD-10-CM | POA: Diagnosis not present

## 2022-08-31 DIAGNOSIS — D72829 Elevated white blood cell count, unspecified: Secondary | ICD-10-CM | POA: Insufficient documentation

## 2022-08-31 DIAGNOSIS — K5730 Diverticulosis of large intestine without perforation or abscess without bleeding: Secondary | ICD-10-CM | POA: Diagnosis not present

## 2022-08-31 DIAGNOSIS — N200 Calculus of kidney: Secondary | ICD-10-CM | POA: Diagnosis not present

## 2022-08-31 LAB — URINALYSIS, ROUTINE W REFLEX MICROSCOPIC
Bilirubin Urine: NEGATIVE
Glucose, UA: NEGATIVE mg/dL
Ketones, ur: NEGATIVE mg/dL
Leukocytes,Ua: NEGATIVE
Nitrite: NEGATIVE
Protein, ur: NEGATIVE mg/dL
Specific Gravity, Urine: 1.023 (ref 1.005–1.030)
pH: 5 (ref 5.0–8.0)

## 2022-08-31 LAB — CBC WITH DIFFERENTIAL/PLATELET
Abs Immature Granulocytes: 0.03 10*3/uL (ref 0.00–0.07)
Basophils Absolute: 0.1 10*3/uL (ref 0.0–0.1)
Basophils Relative: 0 %
Eosinophils Absolute: 0 10*3/uL (ref 0.0–0.5)
Eosinophils Relative: 0 %
HCT: 37.4 % (ref 36.0–46.0)
Hemoglobin: 12.4 g/dL (ref 12.0–15.0)
Immature Granulocytes: 0 %
Lymphocytes Relative: 16 %
Lymphs Abs: 2 10*3/uL (ref 0.7–4.0)
MCH: 29.5 pg (ref 26.0–34.0)
MCHC: 33.2 g/dL (ref 30.0–36.0)
MCV: 89 fL (ref 80.0–100.0)
Monocytes Absolute: 1 10*3/uL (ref 0.1–1.0)
Monocytes Relative: 8 %
Neutro Abs: 9.2 10*3/uL — ABNORMAL HIGH (ref 1.7–7.7)
Neutrophils Relative %: 76 %
Platelets: 207 10*3/uL (ref 150–400)
RBC: 4.2 MIL/uL (ref 3.87–5.11)
RDW: 11.8 % (ref 11.5–15.5)
WBC: 12.3 10*3/uL — ABNORMAL HIGH (ref 4.0–10.5)
nRBC: 0 % (ref 0.0–0.2)

## 2022-08-31 LAB — COMPREHENSIVE METABOLIC PANEL
ALT: 27 U/L (ref 0–44)
AST: 27 U/L (ref 15–41)
Albumin: 3.4 g/dL — ABNORMAL LOW (ref 3.5–5.0)
Alkaline Phosphatase: 45 U/L (ref 38–126)
Anion gap: 11 (ref 5–15)
BUN: 15 mg/dL (ref 6–20)
CO2: 26 mmol/L (ref 22–32)
Calcium: 9 mg/dL (ref 8.9–10.3)
Chloride: 102 mmol/L (ref 98–111)
Creatinine, Ser: 0.93 mg/dL (ref 0.44–1.00)
GFR, Estimated: >60 mL/min (ref >60)
Glucose, Bld: 118 mg/dL — ABNORMAL HIGH (ref 70–99)
Potassium: 4 mmol/L (ref 3.5–5.1)
Sodium: 139 mmol/L (ref 135–145)
Total Bilirubin: 0.4 mg/dL (ref 0.3–1.2)
Total Protein: 6.9 g/dL (ref 6.5–8.1)

## 2022-08-31 LAB — LIPASE, BLOOD: Lipase: 33 U/L (ref 11–51)

## 2022-08-31 MED ORDER — ONDANSETRON 4 MG PO TBDP: 4.0000 mg | ORAL_TABLET | Freq: Three times a day (TID) | ORAL | 0 refills | Status: AC | PRN

## 2022-08-31 MED ORDER — IOHEXOL 350 MG/ML SOLN
75.0000 mL | Freq: Once | INTRAVENOUS | Status: AC | PRN
  Administered 2022-08-31: 75 mL via INTRAVENOUS

## 2022-08-31 MED ORDER — MORPHINE SULFATE (PF) 4 MG/ML IV SOLN
4.0000 mg | Freq: Once | INTRAVENOUS | Status: AC
  Administered 2022-08-31: 4 mg via INTRAVENOUS
  Filled 2022-08-31: qty 1

## 2022-08-31 MED ORDER — ONDANSETRON HCL 4 MG/2ML IJ SOLN
4.0000 mg | Freq: Once | INTRAMUSCULAR | Status: AC
  Administered 2022-08-31: 4 mg via INTRAVENOUS
  Filled 2022-08-31: qty 2

## 2022-08-31 MED ORDER — METRONIDAZOLE 500 MG PO TABS
500.0000 mg | ORAL_TABLET | Freq: Once | ORAL | Status: AC
  Administered 2022-08-31: 500 mg via ORAL
  Filled 2022-08-31: qty 1

## 2022-08-31 MED ORDER — OXYCODONE-ACETAMINOPHEN 5-325 MG PO TABS: 1.0000 | ORAL_TABLET | Freq: Four times a day (QID) | ORAL | 0 refills | Status: AC | PRN

## 2022-08-31 MED ORDER — CIPROFLOXACIN HCL 500 MG PO TABS
500.0000 mg | ORAL_TABLET | Freq: Once | ORAL | Status: AC
  Administered 2022-08-31: 500 mg via ORAL
  Filled 2022-08-31: qty 1

## 2022-08-31 MED ORDER — SODIUM CHLORIDE 0.9 % IV BOLUS
1000.0000 mL | Freq: Once | INTRAVENOUS | Status: AC
  Administered 2022-08-31: 1000 mL via INTRAVENOUS

## 2022-08-31 MED ORDER — CIPROFLOXACIN HCL 500 MG PO TABS: 500.0000 mg | ORAL_TABLET | Freq: Two times a day (BID) | ORAL | 0 refills | Status: AC

## 2022-08-31 MED ORDER — OXYCODONE-ACETAMINOPHEN 5-325 MG PO TABS
1.0000 | ORAL_TABLET | Freq: Once | ORAL | Status: AC
  Administered 2022-08-31: 1 via ORAL
  Filled 2022-08-31: qty 1

## 2022-08-31 MED ORDER — METRONIDAZOLE 500 MG PO TABS: 500.0000 mg | ORAL_TABLET | Freq: Two times a day (BID) | ORAL | 0 refills | Status: AC

## 2022-08-31 NOTE — ED Triage Notes (Signed)
The pt is c/o lower abd pain for 4 days no n v  but some diarrhea.  No previous history

## 2022-08-31 NOTE — ED Provider Notes (Signed)
[facility]-EMERGENCY DEPT [facility] Emergency Department Provider Note MRN:  [id number]  Arrival date & time: 08/31/22     Chief Complaint   Abdominal Pain   History of Present Illness   Katherine Boyer is a 56 y.o. year-old female presents to the ED with chief complaint of lower abdominal pain.  States that she had n/v/d earlier in the week.  On Friday began having some lower abdominal pain which worsened on Saturday night and brought her to the ER.  States that she no longer has nausea or vomiting.  She states that she has never had prior abdominal surgeries.    History provided by patient.   Review of Systems  Pertinent positive and negative review of systems noted in HPI.    Physical Exam   Vitals:   08/31/22 0544 08/31/22 0600  BP: 106/65 98/66  Pulse: 88 86  Resp: 16   Temp: 98.3 F (36.8 C)   SpO2: 94% 94%    CONSTITUTIONAL:  non toxic-appearing, NAD NEURO:  Alert and oriented x 3, CN 3-12 grossly intact EYES:  eyes equal and reactive ENT/NECK:  Supple, no stridor  CARDIO:  normal rate, regular rhythm, appears well-perfused  PULM:  No respiratory distress, CTAB GI/GU:  non-distended, lower abdominal tenderness MSK/SPINE:  No gross deformities, no edema, moves all extremities  SKIN:  no rash, atraumatic   *Additional and/or pertinent findings included in MDM below  Diagnostic and Interventional Summary    EKG Interpretation  Date/Time:    Ventricular Rate:    PR Interval:    QRS Duration:   QT Interval:    QTC Calculation:   R Axis:     Text Interpretation:         Labs Reviewed  COMPREHENSIVE METABOLIC PANEL - Abnormal; Notable for the following components:      Result Value   Glucose, Bld 118 (*)    Albumin 3.4 (*)    All other components within normal limits  CBC WITH DIFFERENTIAL/PLATELET - Abnormal; Notable for the following components:   WBC 12.3 (*)    Neutro Abs 9.2 (*)    All other components within normal limits  URINALYSIS,  ROUTINE W REFLEX MICROSCOPIC - Abnormal; Notable for the following components:   Hgb urine dipstick MODERATE (*)    Bacteria, UA RARE (*)    All other components within normal limits  LIPASE, BLOOD    CT Abdomen Pelvis W Contrast  Final Result      Medications  oxyCODONE-acetaminophen (PERCOCET/ROXICET) 5-325 MG per tablet 1 tablet (has no administration in time range)  metroNIDAZOLE (FLAGYL) tablet 500 mg (has no administration in time range)  ciprofloxacin (CIPRO) tablet 500 mg (has no administration in time range)  morphine (PF) 4 MG/ML injection 4 mg (4 mg Intravenous Given 08/31/22 0525)  ondansetron (ZOFRAN) injection 4 mg (4 mg Intravenous Given 08/31/22 0525)  sodium chloride 0.9 % bolus 1,000 mL (1,000 mLs Intravenous New Bag/Given 08/31/22 0520)  iohexol (OMNIPAQUE) 350 MG/ML injection 75 mL (75 mLs Intravenous Contrast Given 08/31/22 2725)     Procedures  /  Critical Care Procedures  ED Course and Medical Decision Making  I have reviewed the triage vital signs, the nursing notes, and pertinent available records from the EMR.  Social Determinants Affecting Complexity of Care: Patient has no clinically significant social determinants affecting this chief complaint..   ED Course:    Medical Decision Making Patient here with lower abdominal pain.  Had GI symptoms earlier this week.  Symptoms have improved save for the lower abdominal pain.    CT consistent with diverticulitis.  Overall, she looks well and pain is well controlled.  I don't think that she needs to be admitted.  Will treat on outpatient basis.  Amount and/or Complexity of Data Reviewed Labs: ordered.    Details: Mildly elevated leukocytosis Radiology: ordered and independent interpretation performed.    Details: No free air  Risk Prescription drug management.     Consultants: No consultations were needed in caring for this patient.   Treatment and Plan: I considered admission due to patient's  initial presentation, but after considering the examination and diagnostic results, patient will not require admission and can be discharged with outpatient follow-up.    Final Clinical Impressions(s) / ED Diagnoses     ICD-10-CM   1. Diverticulitis  K57.92       ED Discharge Orders          Ordered    ciprofloxacin (CIPRO) 500 MG tablet  Every 12 hours        08/31/22 0655    metroNIDAZOLE (FLAGYL) 500 MG tablet  2 times daily        08/31/22 0655    oxyCODONE-acetaminophen (PERCOCET) 5-325 MG tablet  Every 6 hours PRN        08/31/22 0655    ondansetron (ZOFRAN-ODT) 4 MG disintegrating tablet  Every 8 hours PRN        08/31/22 7591              Discharge Instructions Discussed with and Provided to Patient:   Discharge Instructions   None      Roxy Horseman, PA-C 08/31/22 6384    Cathren Laine, MD 09/01/22 1625

## 2022-08-31 NOTE — ED Provider Triage Note (Signed)
Emergency Medicine Provider Triage Evaluation Note  Katherine Boyer , a 56 y.o. female  was evaluated in triage.  Pt complains of abdominal pain.  She presents to the ED for lower abdominal pain that started Saturday.  Had a GI bug earlier in the week (diarrhea alone).  No current V/D.  Took pepto-bismo.    Review of Systems  Positive: Lower abdominal pain Negative: Fever, V/D, dysuria  Physical Exam  BP 127/84 (BP Location: Right Arm)   Pulse 93   Temp 98.4 F (36.9 C) (Oral)   Resp 18   SpO2 97%  Gen:   Awake, no distress   Resp:  Normal effort  MSK:   Moves extremities without difficulty  Abd:  Moderate lower abdominal tenderness, no guarding  Medical Decision Making  Medically screening exam initiated at 2:23 AM.  Appropriate orders placed.  Katherine Boyer was informed that the remainder of the evaluation will be completed by another provider, this initial triage assessment does not replace that evaluation, and the importance of remaining in the ED until their evaluation is complete.     Tilden Fossa, MD 08/31/22 [phone]

## 2022-09-10 DIAGNOSIS — Z6821 Body mass index (BMI) 21.0-21.9, adult: Secondary | ICD-10-CM | POA: Diagnosis not present

## 2022-09-10 DIAGNOSIS — K5792 Diverticulitis of intestine, part unspecified, without perforation or abscess without bleeding: Secondary | ICD-10-CM | POA: Diagnosis not present

## 2022-09-12 DIAGNOSIS — M810 Age-related osteoporosis without current pathological fracture: Secondary | ICD-10-CM | POA: Diagnosis not present

## 2022-09-19 DIAGNOSIS — J020 Streptococcal pharyngitis: Secondary | ICD-10-CM | POA: Diagnosis not present

## 2022-09-19 DIAGNOSIS — M791 Myalgia, unspecified site: Secondary | ICD-10-CM | POA: Diagnosis not present

## 2022-11-06 DIAGNOSIS — H02831 Dermatochalasis of right upper eyelid: Secondary | ICD-10-CM | POA: Diagnosis not present

## 2022-11-06 DIAGNOSIS — H02834 Dermatochalasis of left upper eyelid: Secondary | ICD-10-CM | POA: Diagnosis not present

## 2022-11-06 DIAGNOSIS — H40013 Open angle with borderline findings, low risk, bilateral: Secondary | ICD-10-CM | POA: Diagnosis not present

## 2022-11-06 DIAGNOSIS — H25813 Combined forms of age-related cataract, bilateral: Secondary | ICD-10-CM | POA: Diagnosis not present

## 2023-01-02 DIAGNOSIS — E7800 Pure hypercholesterolemia, unspecified: Secondary | ICD-10-CM | POA: Diagnosis not present

## 2023-01-02 DIAGNOSIS — Z6822 Body mass index (BMI) 22.0-22.9, adult: Secondary | ICD-10-CM | POA: Diagnosis not present

## 2023-01-02 DIAGNOSIS — R079 Chest pain, unspecified: Secondary | ICD-10-CM | POA: Diagnosis not present

## 2023-01-02 DIAGNOSIS — I10 Essential (primary) hypertension: Secondary | ICD-10-CM | POA: Diagnosis not present

## 2023-01-13 DIAGNOSIS — R079 Chest pain, unspecified: Secondary | ICD-10-CM | POA: Diagnosis not present

## 2023-02-11 DIAGNOSIS — Z6822 Body mass index (BMI) 22.0-22.9, adult: Secondary | ICD-10-CM | POA: Diagnosis not present

## 2023-02-11 DIAGNOSIS — Z1231 Encounter for screening mammogram for malignant neoplasm of breast: Secondary | ICD-10-CM | POA: Diagnosis not present

## 2023-02-11 DIAGNOSIS — M810 Age-related osteoporosis without current pathological fracture: Secondary | ICD-10-CM | POA: Diagnosis not present

## 2023-02-11 DIAGNOSIS — R319 Hematuria, unspecified: Secondary | ICD-10-CM | POA: Diagnosis not present

## 2023-02-11 DIAGNOSIS — Z01419 Encounter for gynecological examination (general) (routine) without abnormal findings: Secondary | ICD-10-CM | POA: Diagnosis not present

## 2023-03-01 DIAGNOSIS — R051 Acute cough: Secondary | ICD-10-CM | POA: Diagnosis not present

## 2023-03-01 DIAGNOSIS — R070 Pain in throat: Secondary | ICD-10-CM | POA: Diagnosis not present

## 2023-03-01 DIAGNOSIS — J209 Acute bronchitis, unspecified: Secondary | ICD-10-CM | POA: Diagnosis not present

## 2023-03-06 DIAGNOSIS — M810 Age-related osteoporosis without current pathological fracture: Secondary | ICD-10-CM | POA: Diagnosis not present

## 2023-08-16 IMAGING — MR MR SHOULDER*R* W/O CM
4 of 5 series · 21 of 40 positions shown · non-contrast
Comparison: None.

CLINICAL DATA: Pain and limited range of motion, injured being
pulled by a dog.

EXAM:
MRI OF THE RIGHT SHOULDER WITHOUT CONTRAST
TECHNIQUE: Multiplanar, multisequence MR imaging of the shoulder was performed.
No intravenous contrast was administered.

[Series 6: T2 fat-sat · axial · right · 3.0mm · 0.47mm/px · z∈[-51,+45]mm · 8 of 27 slices shown (1 of 3)]
[im 1/27]
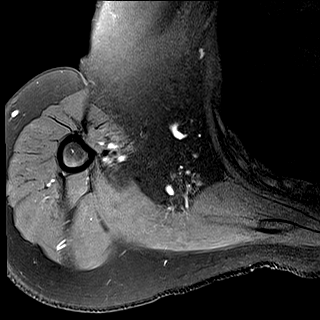
[im 3/27]
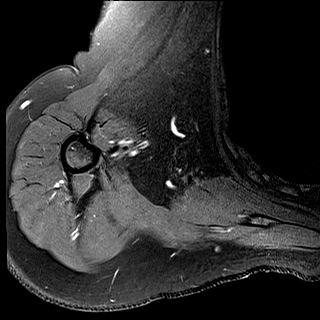
[im 9/27]
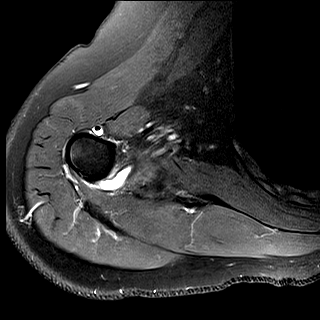
[im 12/27]
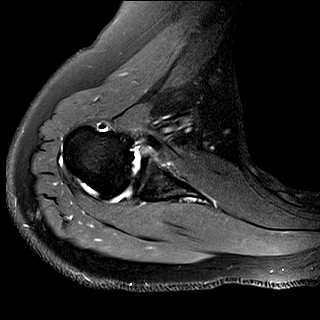
[im 15/27]
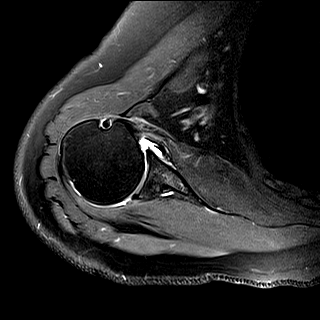
[im 18/27]
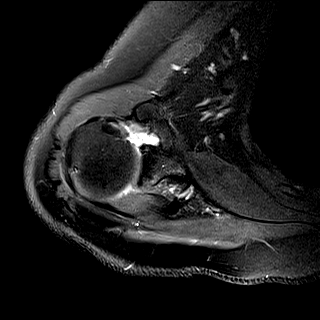
[im 24/27]
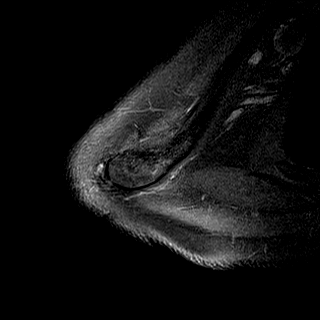
[im 27/27]
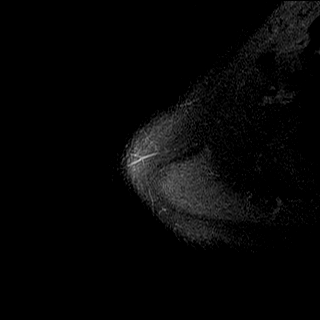

[Series 7: T2 fat-sat · oblique · right · 4.0mm · 0.22mm/px · 3 of 21 slices shown (2 of 3)]
[im 4/21]
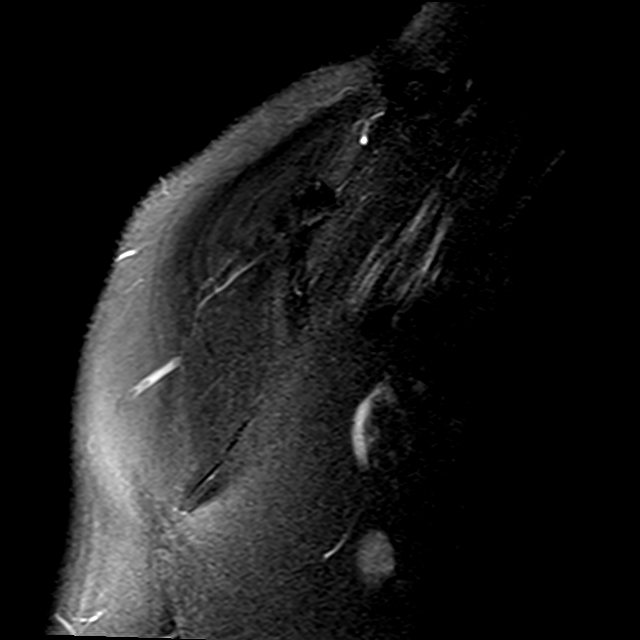
[im 11/21]
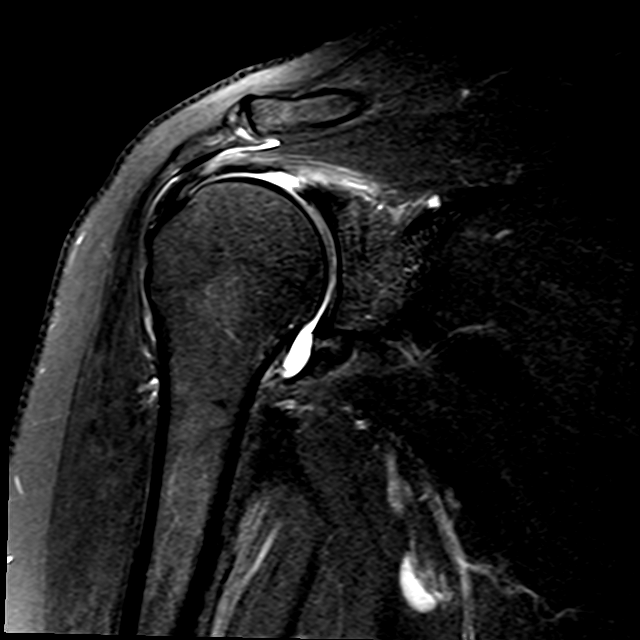
[im 17/21]
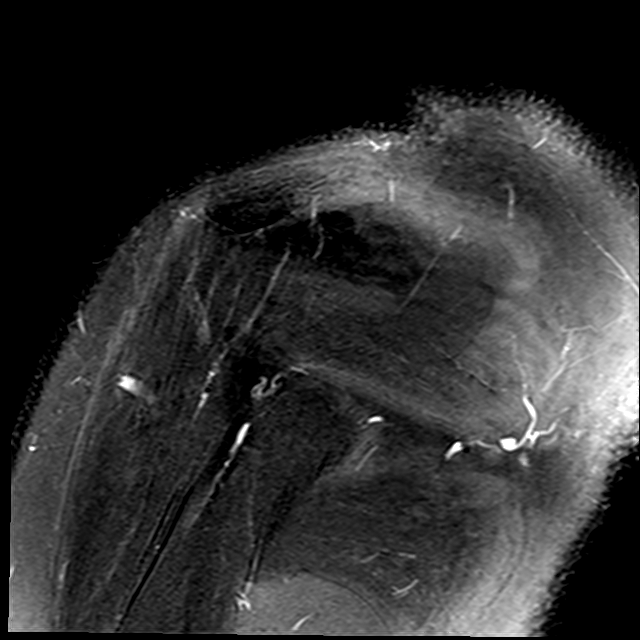

[Series 8: PD · oblique · right · 4.0mm · 0.22mm/px · 7 of 21 slices shown]
[im 1/21]
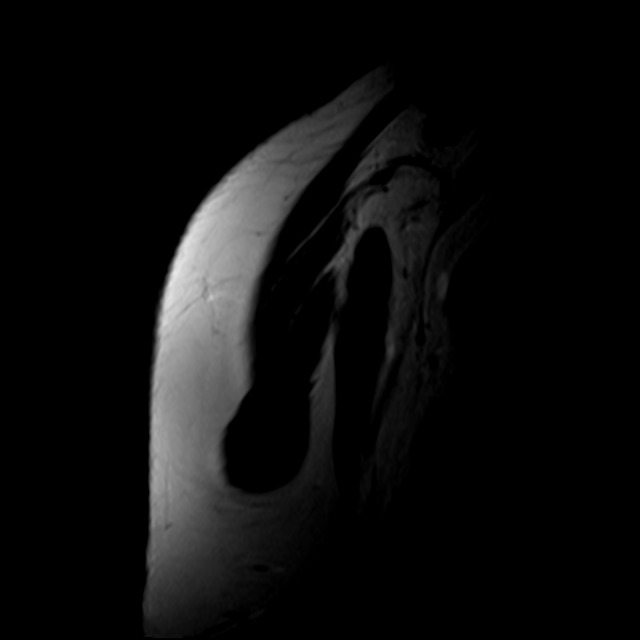
[im 4/21]
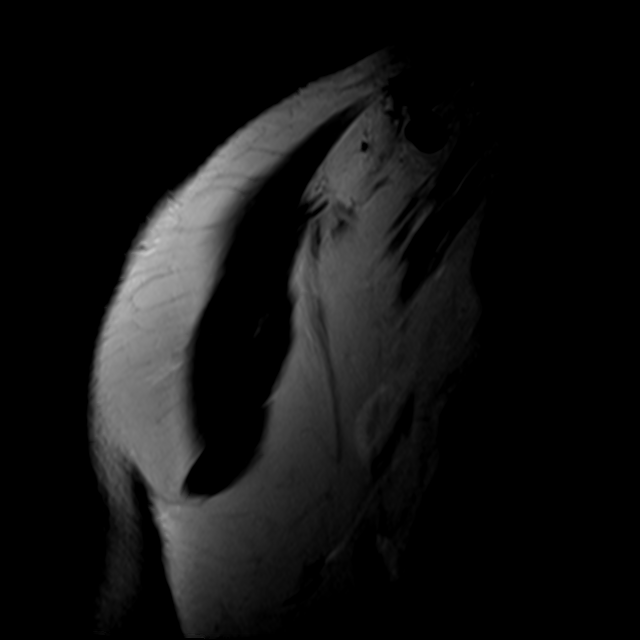
[im 7/21]
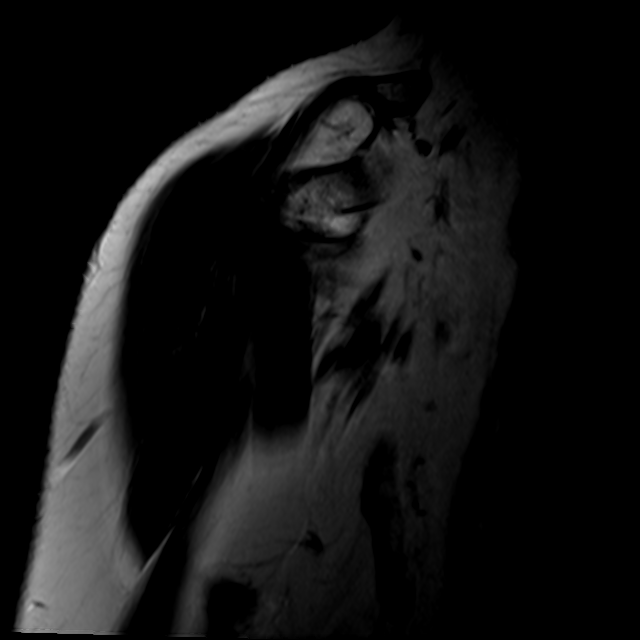
[im 11/21]
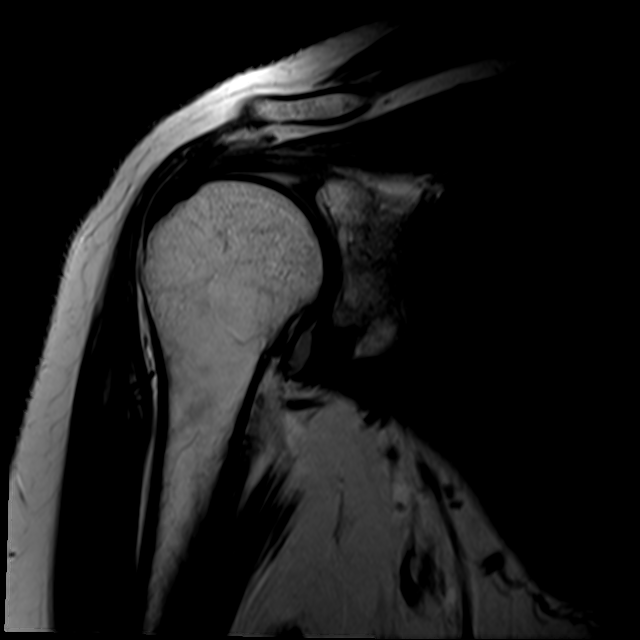
[im 14/21]
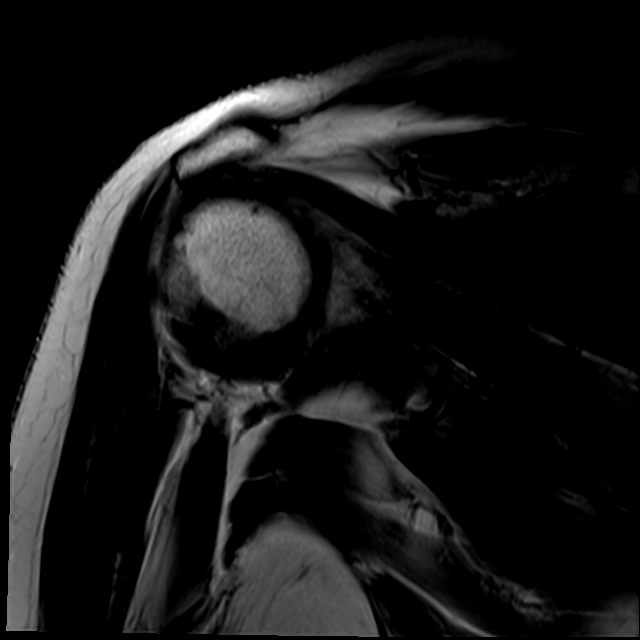
[im 17/21]
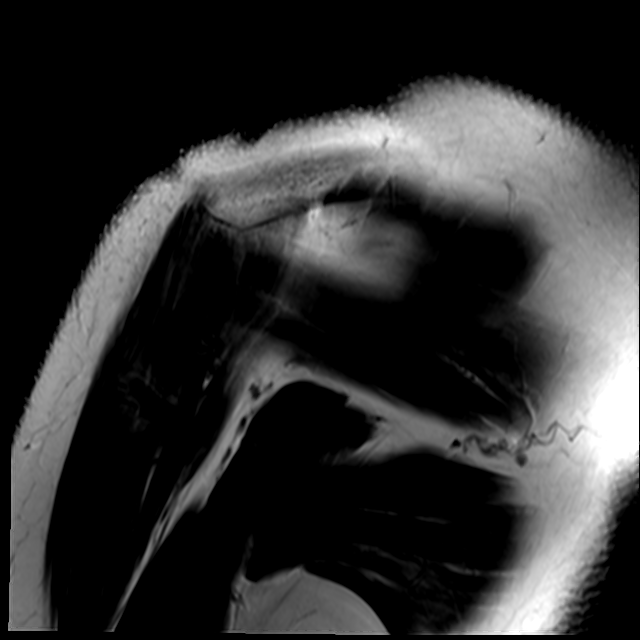
[im 21/21]
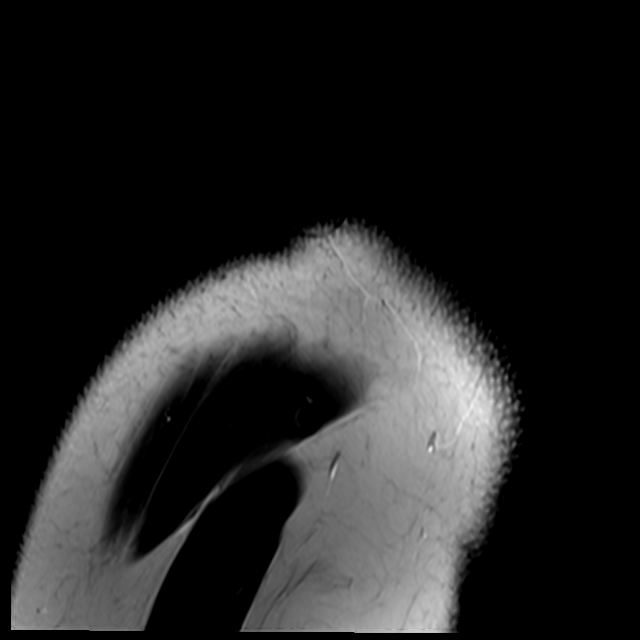

[Series 9: T2 fat-sat · oblique · right · 4.0mm · 0.44mm/px · 3 of 23 slices shown (3 of 3)]
[im 4/23]
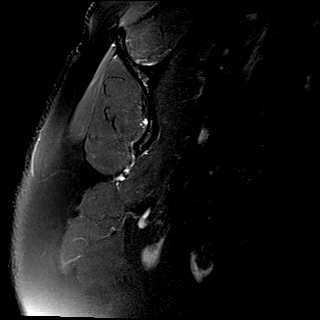
[im 13/23]
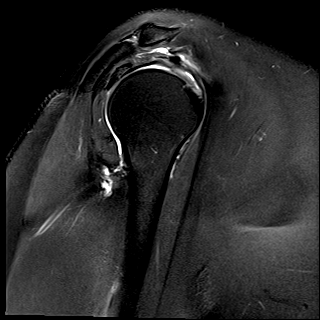
[im 19/23]
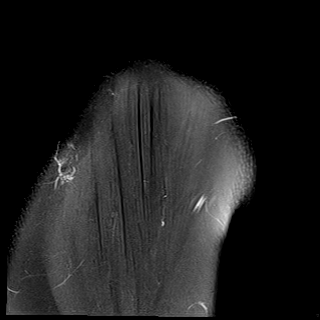

[21 of 40 positions shown; findings below may reference images not displayed]

FINDINGS: Rotator cuff: Intermediate intrasubstance signal of the distal
supraspinatus tendon with low-grade partial-thickness (less than
50%) tear (series 7, image 12; series 9 image 9-10). Infraspinatus
tendon is intact. Teres minor tendon is intact. Subscapularis tendon
is intact.

Muscles: No muscle atrophy or edema. No intramuscular fluid
collection or hematoma.

Biceps Long Head: Intraarticular and extraarticular portions of the
biceps tendon are intact.

Acromioclavicular Joint: Mild arthropathy of the acromioclavicular
joint. No subacromial/subdeltoid bursal fluid.

Glenohumeral Joint: Small joint effusion. Generalized articular
cartilage thinning. No full-thickness defect.

Labrum: Mild degenerative changes without evidence of acute tear.

Bones: No fracture or dislocation. No aggressive osseous lesion.

Other: No fluid collection or hematoma.
IMPRESSION: 1. Tendinopathy with partial-thickness (less than 50%) articular
surface tear of the supraspinatus tendon.

2. Small joint effusion, likely reactive. Mild glenohumeral
osteoarthritis.

3.  Mild acromioclavicular osteoarthritis.

4. No evidence of fracture or dislocation. Muscles are normal in
bulk without evidence of atrophy.

## 2023-09-02 DIAGNOSIS — M810 Age-related osteoporosis without current pathological fracture: Secondary | ICD-10-CM | POA: Diagnosis not present

## 2023-09-08 DIAGNOSIS — M810 Age-related osteoporosis without current pathological fracture: Secondary | ICD-10-CM | POA: Diagnosis not present

## 2023-09-09 DIAGNOSIS — R509 Fever, unspecified: Secondary | ICD-10-CM | POA: Diagnosis not present

## 2023-09-09 DIAGNOSIS — R0981 Nasal congestion: Secondary | ICD-10-CM | POA: Diagnosis not present

## 2023-09-09 DIAGNOSIS — J069 Acute upper respiratory infection, unspecified: Secondary | ICD-10-CM | POA: Diagnosis not present

## 2023-10-01 DIAGNOSIS — J101 Influenza due to other identified influenza virus with other respiratory manifestations: Secondary | ICD-10-CM | POA: Diagnosis not present

## 2023-10-01 DIAGNOSIS — R059 Cough, unspecified: Secondary | ICD-10-CM | POA: Diagnosis not present

## 2023-10-01 DIAGNOSIS — Z20822 Contact with and (suspected) exposure to covid-19: Secondary | ICD-10-CM | POA: Diagnosis not present

## 2023-10-05 DIAGNOSIS — J019 Acute sinusitis, unspecified: Secondary | ICD-10-CM | POA: Diagnosis not present

## 2023-10-05 DIAGNOSIS — Z6822 Body mass index (BMI) 22.0-22.9, adult: Secondary | ICD-10-CM | POA: Diagnosis not present

## 2023-10-05 DIAGNOSIS — B001 Herpesviral vesicular dermatitis: Secondary | ICD-10-CM | POA: Diagnosis not present

## 2023-10-26 DIAGNOSIS — R42 Dizziness and giddiness: Secondary | ICD-10-CM | POA: Diagnosis not present

## 2023-10-26 DIAGNOSIS — J069 Acute upper respiratory infection, unspecified: Secondary | ICD-10-CM | POA: Diagnosis not present

## 2023-10-26 DIAGNOSIS — R0981 Nasal congestion: Secondary | ICD-10-CM | POA: Diagnosis not present

## 2023-11-30 DIAGNOSIS — R051 Acute cough: Secondary | ICD-10-CM | POA: Diagnosis not present

## 2023-11-30 DIAGNOSIS — R0981 Nasal congestion: Secondary | ICD-10-CM | POA: Diagnosis not present

## 2023-12-07 DIAGNOSIS — Z6823 Body mass index (BMI) 23.0-23.9, adult: Secondary | ICD-10-CM | POA: Diagnosis not present

## 2023-12-07 DIAGNOSIS — J9801 Acute bronchospasm: Secondary | ICD-10-CM | POA: Diagnosis not present

## 2023-12-07 DIAGNOSIS — I10 Essential (primary) hypertension: Secondary | ICD-10-CM | POA: Diagnosis not present

## 2023-12-07 DIAGNOSIS — K219 Gastro-esophageal reflux disease without esophagitis: Secondary | ICD-10-CM | POA: Diagnosis not present

## 2024-02-19 DIAGNOSIS — R233 Spontaneous ecchymoses: Secondary | ICD-10-CM | POA: Diagnosis not present

## 2024-02-19 DIAGNOSIS — Z6822 Body mass index (BMI) 22.0-22.9, adult: Secondary | ICD-10-CM | POA: Diagnosis not present

## 2024-02-19 DIAGNOSIS — M810 Age-related osteoporosis without current pathological fracture: Secondary | ICD-10-CM | POA: Diagnosis not present

## 2024-02-21 DIAGNOSIS — Z79899 Other long term (current) drug therapy: Secondary | ICD-10-CM | POA: Diagnosis not present

## 2024-02-21 DIAGNOSIS — R509 Fever, unspecified: Secondary | ICD-10-CM | POA: Diagnosis not present

## 2024-02-21 DIAGNOSIS — D696 Thrombocytopenia, unspecified: Secondary | ICD-10-CM | POA: Diagnosis not present

## 2024-02-25 DIAGNOSIS — H25813 Combined forms of age-related cataract, bilateral: Secondary | ICD-10-CM | POA: Diagnosis not present

## 2024-02-25 DIAGNOSIS — H40019 Open angle with borderline findings, low risk, unspecified eye: Secondary | ICD-10-CM | POA: Diagnosis not present

## 2024-02-29 DIAGNOSIS — Z6822 Body mass index (BMI) 22.0-22.9, adult: Secondary | ICD-10-CM | POA: Diagnosis not present

## 2024-02-29 DIAGNOSIS — R509 Fever, unspecified: Secondary | ICD-10-CM | POA: Diagnosis not present

## 2024-03-07 DIAGNOSIS — R233 Spontaneous ecchymoses: Secondary | ICD-10-CM | POA: Diagnosis not present

## 2024-03-07 DIAGNOSIS — R7401 Elevation of levels of liver transaminase levels: Secondary | ICD-10-CM | POA: Diagnosis not present

## 2024-03-07 DIAGNOSIS — Z1339 Encounter for screening examination for other mental health and behavioral disorders: Secondary | ICD-10-CM | POA: Diagnosis not present

## 2024-03-07 DIAGNOSIS — R509 Fever, unspecified: Secondary | ICD-10-CM | POA: Diagnosis not present

## 2024-03-08 DIAGNOSIS — M810 Age-related osteoporosis without current pathological fracture: Secondary | ICD-10-CM | POA: Diagnosis not present

## 2024-05-20 DIAGNOSIS — R7401 Elevation of levels of liver transaminase levels: Secondary | ICD-10-CM | POA: Diagnosis not present

## 2024-05-20 DIAGNOSIS — Z6821 Body mass index (BMI) 21.0-21.9, adult: Secondary | ICD-10-CM | POA: Diagnosis not present

## 2024-08-17 DIAGNOSIS — M810 Age-related osteoporosis without current pathological fracture: Secondary | ICD-10-CM | POA: Diagnosis not present

## 2024-09-02 DIAGNOSIS — M810 Age-related osteoporosis without current pathological fracture: Secondary | ICD-10-CM | POA: Diagnosis not present

## 2024-09-16 DIAGNOSIS — J0100 Acute maxillary sinusitis, unspecified: Secondary | ICD-10-CM | POA: Diagnosis not present

## 2024-09-16 DIAGNOSIS — R051 Acute cough: Secondary | ICD-10-CM | POA: Diagnosis not present

## 2024-09-16 DIAGNOSIS — R0981 Nasal congestion: Secondary | ICD-10-CM | POA: Diagnosis not present
